# Patient Record
Sex: Male | Born: 1990 | Race: White | Hispanic: No | Marital: Single | Smoking: Never smoker
Health system: Southern US, Community
[De-identification: ages and names within clinical notes are randomized; demographics above are authoritative.]

## PROBLEM LIST (undated history)

## (undated) HISTORY — PX: CLAVICLE SURGERY: SHX598

---
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
## 2008-03-25 ENCOUNTER — Emergency Department (HOSPITAL_COMMUNITY): Admission: EM | Admit: 2008-03-25 | Discharge: 2008-03-25 | Payer: Self-pay | Admitting: Emergency Medicine

## 2010-06-06 ENCOUNTER — Inpatient Hospital Stay (HOSPITAL_COMMUNITY): Admission: AC | Admit: 2010-06-06 | Discharge: 2010-07-01 | Payer: Self-pay

## 2010-06-18 ENCOUNTER — Ambulatory Visit: Payer: Self-pay | Admitting: Physical Medicine & Rehabilitation

## 2010-06-24 ENCOUNTER — Encounter (INDEPENDENT_AMBULATORY_CARE_PROVIDER_SITE_OTHER): Payer: Self-pay | Admitting: General Surgery

## 2010-06-24 ENCOUNTER — Ambulatory Visit: Payer: Self-pay | Admitting: Vascular Surgery

## 2010-06-25 ENCOUNTER — Encounter (INDEPENDENT_AMBULATORY_CARE_PROVIDER_SITE_OTHER): Payer: Self-pay | Admitting: General Surgery

## 2010-07-01 ENCOUNTER — Inpatient Hospital Stay (HOSPITAL_COMMUNITY)
Admission: RE | Admit: 2010-07-01 | Discharge: 2010-07-28 | Payer: Self-pay | Admitting: Physical Medicine & Rehabilitation

## 2010-07-01 ENCOUNTER — Ambulatory Visit: Payer: Self-pay | Admitting: Physical Medicine & Rehabilitation

## 2010-07-04 ENCOUNTER — Ambulatory Visit: Payer: Self-pay | Admitting: Physical Medicine & Rehabilitation

## 2010-07-24 ENCOUNTER — Ambulatory Visit: Payer: Self-pay | Admitting: Physical Medicine & Rehabilitation

## 2010-07-30 ENCOUNTER — Encounter (HOSPITAL_COMMUNITY)
Admission: RE | Admit: 2010-07-30 | Discharge: 2010-08-29 | Payer: Self-pay | Admitting: Physical Medicine & Rehabilitation

## 2010-09-01 ENCOUNTER — Encounter (HOSPITAL_COMMUNITY)
Admission: RE | Admit: 2010-09-01 | Discharge: 2010-09-04 | Payer: Self-pay | Admitting: Physical Medicine & Rehabilitation

## 2010-09-01 ENCOUNTER — Encounter
Admission: RE | Admit: 2010-09-01 | Discharge: 2010-09-01 | Payer: Self-pay | Source: Home / Self Care | Attending: Physical Medicine & Rehabilitation | Admitting: Physical Medicine & Rehabilitation

## 2010-09-08 ENCOUNTER — Observation Stay (HOSPITAL_COMMUNITY): Admission: RE | Admit: 2010-09-08 | Discharge: 2010-09-09 | Payer: Self-pay | Admitting: Orthopaedic Surgery

## 2010-09-16 ENCOUNTER — Encounter (HOSPITAL_COMMUNITY)
Admission: RE | Admit: 2010-09-16 | Discharge: 2010-10-16 | Payer: Self-pay | Admitting: Physical Medicine & Rehabilitation

## 2010-10-15 ENCOUNTER — Encounter
Admission: RE | Admit: 2010-10-15 | Discharge: 2010-12-15 | Payer: Self-pay | Source: Home / Self Care | Attending: Physical Medicine & Rehabilitation | Admitting: Physical Medicine & Rehabilitation

## 2010-10-23 ENCOUNTER — Ambulatory Visit: Payer: Self-pay | Admitting: Physical Medicine & Rehabilitation

## 2010-10-23 ENCOUNTER — Ambulatory Visit (HOSPITAL_COMMUNITY)
Admission: RE | Admit: 2010-10-23 | Discharge: 2010-10-23 | Payer: Self-pay | Admitting: Physical Medicine & Rehabilitation

## 2010-11-03 ENCOUNTER — Ambulatory Visit (HOSPITAL_COMMUNITY)
Admission: RE | Admit: 2010-11-03 | Discharge: 2010-11-03 | Payer: Self-pay | Source: Home / Self Care | Admitting: Physical Medicine & Rehabilitation

## 2010-12-15 ENCOUNTER — Encounter
Admission: RE | Admit: 2010-12-15 | Discharge: 2011-01-05 | Payer: Self-pay | Source: Home / Self Care | Attending: Physical Medicine & Rehabilitation | Admitting: Physical Medicine & Rehabilitation

## 2010-12-18 ENCOUNTER — Ambulatory Visit
Admission: RE | Admit: 2010-12-18 | Discharge: 2010-12-18 | Payer: Self-pay | Source: Home / Self Care | Attending: Physical Medicine & Rehabilitation | Admitting: Physical Medicine & Rehabilitation

## 2011-02-18 LAB — SURGICAL PCR SCREEN: Staphylococcus aureus: POSITIVE — AB

## 2011-02-19 LAB — PREALBUMIN: Prealbumin: 21 mg/dL (ref 18.0–45.0)

## 2011-02-19 LAB — CBC
HCT: 39.8 % (ref 39.0–52.0)
Hemoglobin: 13.2 g/dL (ref 13.0–17.0)
MCHC: 33.2 g/dL (ref 30.0–36.0)
MCV: 95.9 fL (ref 78.0–100.0)
RDW: 13.9 % (ref 11.5–15.5)

## 2011-02-19 LAB — BASIC METABOLIC PANEL
BUN: 7 mg/dL (ref 6–23)
GFR calc Af Amer: >60 mL/min (ref >60)
GFR calc non Af Amer: >60 mL/min (ref >60)
Glucose, Bld: 97 mg/dL (ref 70–99)

## 2011-02-20 LAB — COMPREHENSIVE METABOLIC PANEL
ALT: 19 U/L (ref 0–53)
AST: 28 U/L (ref 0–37)
CO2: 27 mEq/L (ref 19–32)
Chloride: 104 mEq/L (ref 96–112)
Creatinine, Ser: 0.59 mg/dL (ref 0.4–1.5)
GFR calc Af Amer: >60 mL/min (ref >60)
GFR calc non Af Amer: >60 mL/min (ref >60)
Glucose, Bld: 97 mg/dL (ref 70–99)
Total Bilirubin: 0.6 mg/dL (ref 0.3–1.2)

## 2011-02-20 LAB — CBC
HCT: 36 % — ABNORMAL LOW (ref 39.0–52.0)
HCT: 37.6 % — ABNORMAL LOW (ref 39.0–52.0)
HCT: 40.8 % (ref 39.0–52.0)
Hemoglobin: 12.2 g/dL — ABNORMAL LOW (ref 13.0–17.0)
Hemoglobin: 12.7 g/dL — ABNORMAL LOW (ref 13.0–17.0)
Hemoglobin: 13.8 g/dL (ref 13.0–17.0)
MCH: 31.7 pg (ref 26.0–34.0)
MCH: 32.3 pg (ref 26.0–34.0)
MCH: 32.4 pg (ref 26.0–34.0)
MCH: 32.5 pg (ref 26.0–34.0)
MCHC: 34 g/dL (ref 30.0–36.0)
MCV: 95.3 fL (ref 78.0–100.0)
MCV: 96.1 fL (ref 78.0–100.0)
Platelets: 402 10*3/uL — ABNORMAL HIGH (ref 150–400)
Platelets: 581 10*3/uL — ABNORMAL HIGH (ref 150–400)
Platelets: 653 10*3/uL — ABNORMAL HIGH (ref 150–400)
Platelets: 700 10*3/uL — ABNORMAL HIGH (ref 150–400)
RBC: 3.77 MIL/uL — ABNORMAL LOW (ref 4.22–5.81)
RBC: 3.93 MIL/uL — ABNORMAL LOW (ref 4.22–5.81)
RBC: 3.97 MIL/uL — ABNORMAL LOW (ref 4.22–5.81)
RBC: 4.07 MIL/uL — ABNORMAL LOW (ref 4.22–5.81)
RBC: 4.09 MIL/uL — ABNORMAL LOW (ref 4.22–5.81)
RBC: 4.23 MIL/uL (ref 4.22–5.81)
RDW: 13.9 % (ref 11.5–15.5)
RDW: 14.5 % (ref 11.5–15.5)
RDW: 14.5 % (ref 11.5–15.5)
RDW: 14.6 % (ref 11.5–15.5)
WBC: 14 10*3/uL — ABNORMAL HIGH (ref 4.0–10.5)
WBC: 7.1 10*3/uL (ref 4.0–10.5)
WBC: 8.4 10*3/uL (ref 4.0–10.5)
WBC: 9.6 10*3/uL (ref 4.0–10.5)

## 2011-02-20 LAB — DIFFERENTIAL
Basophils Absolute: 0 10*3/uL (ref 0.0–0.1)
Basophils Relative: 1 % (ref 0–1)
Eosinophils Absolute: 0.4 10*3/uL (ref 0.0–0.7)
Eosinophils Absolute: 0.5 10*3/uL (ref 0.0–0.7)
Eosinophils Absolute: 0.4 10*3/uL (ref 0.0–0.7)
Eosinophils Relative: 5 % (ref 0–5)
Lymphocytes Relative: 13 % (ref 12–46)
Lymphocytes Relative: 19 % (ref 12–46)
Lymphs Abs: 1.7 10*3/uL (ref 0.7–4.0)
Monocytes Absolute: 1 10*3/uL (ref 0.1–1.0)
Monocytes Relative: 6 % (ref 3–12)
Neutro Abs: 10.6 10*3/uL — ABNORMAL HIGH (ref 1.7–7.7)
Neutro Abs: 13.2 10*3/uL — ABNORMAL HIGH (ref 1.7–7.7)
Neutrophils Relative %: 75 % (ref 43–77)
Neutrophils Relative %: 80 % — ABNORMAL HIGH (ref 43–77)

## 2011-02-20 LAB — BASIC METABOLIC PANEL
BUN: 20 mg/dL (ref 6–23)
BUN: 22 mg/dL (ref 6–23)
BUN: 23 mg/dL (ref 6–23)
CO2: 27 mEq/L (ref 19–32)
CO2: 28 mEq/L (ref 19–32)
Calcium: 9.3 mg/dL (ref 8.4–10.5)
Chloride: 104 mEq/L (ref 96–112)
Chloride: 105 mEq/L (ref 96–112)
Chloride: 105 mEq/L (ref 96–112)
Creatinine, Ser: 0.64 mg/dL (ref 0.4–1.5)
Creatinine, Ser: 0.65 mg/dL (ref 0.4–1.5)
Creatinine, Ser: 0.71 mg/dL (ref 0.4–1.5)
GFR calc Af Amer: >60 mL/min (ref >60)
GFR calc Af Amer: >60 mL/min (ref >60)
GFR calc Af Amer: >60 mL/min (ref >60)
GFR calc Af Amer: >60 mL/min (ref >60)
GFR calc non Af Amer: >60 mL/min (ref >60)
GFR calc non Af Amer: >60 mL/min (ref >60)
Glucose, Bld: 119 mg/dL — ABNORMAL HIGH (ref 70–99)
Potassium: 3.7 mEq/L (ref 3.5–5.1)
Sodium: 144 mEq/L (ref 135–145)

## 2011-02-20 LAB — CULTURE, BLOOD (SINGLE): Culture: NO GROWTH

## 2011-02-20 LAB — URINE CULTURE: Colony Count: NO GROWTH

## 2011-02-20 LAB — CULTURE, BAL-QUANTITATIVE W GRAM STAIN

## 2011-02-21 LAB — TYPE AND SCREEN: ABO/RH(D): O POS

## 2011-02-21 LAB — CBC
HCT: 27.4 % — ABNORMAL LOW (ref 39.0–52.0)
HCT: 29.4 % — ABNORMAL LOW (ref 39.0–52.0)
HCT: 29.4 % — ABNORMAL LOW (ref 39.0–52.0)
HCT: 29.7 % — ABNORMAL LOW (ref 39.0–52.0)
HCT: 31.7 % — ABNORMAL LOW (ref 39.0–52.0)
HCT: 32 % — ABNORMAL LOW (ref 39.0–52.0)
HCT: 32.9 % — ABNORMAL LOW (ref 39.0–52.0)
HCT: 34.5 % — ABNORMAL LOW (ref 39.0–52.0)
HCT: 35.5 % — ABNORMAL LOW (ref 39.0–52.0)
Hemoglobin: 10.2 g/dL — ABNORMAL LOW (ref 13.0–17.0)
Hemoglobin: 10.6 g/dL — ABNORMAL LOW (ref 13.0–17.0)
Hemoglobin: 11 g/dL — ABNORMAL LOW (ref 13.0–17.0)
Hemoglobin: 11 g/dL — ABNORMAL LOW (ref 13.0–17.0)
Hemoglobin: 11.4 g/dL — ABNORMAL LOW (ref 13.0–17.0)
Hemoglobin: 12 g/dL — ABNORMAL LOW (ref 13.0–17.0)
Hemoglobin: 13.9 g/dL (ref 13.0–17.0)
Hemoglobin: 9.5 g/dL — ABNORMAL LOW (ref 13.0–17.0)
Hemoglobin: 9.9 g/dL — ABNORMAL LOW (ref 13.0–17.0)
MCH: 32 pg (ref 26.0–34.0)
MCH: 32.3 pg (ref 26.0–34.0)
MCH: 32.4 pg (ref 26.0–34.0)
MCH: 32.6 pg (ref 26.0–34.0)
MCH: 33 pg (ref 26.0–34.0)
MCH: 33 pg (ref 26.0–34.0)
MCH: 33.1 pg (ref 26.0–34.0)
MCH: 33.7 pg (ref 26.0–34.0)
MCHC: 33.8 g/dL (ref 30.0–36.0)
MCHC: 33.9 g/dL (ref 30.0–36.0)
MCHC: 34.3 g/dL (ref 30.0–36.0)
MCHC: 34.5 g/dL (ref 30.0–36.0)
MCHC: 34.7 g/dL (ref 30.0–36.0)
MCHC: 34.7 g/dL (ref 30.0–36.0)
MCV: 94.7 fL (ref 78.0–100.0)
MCV: 95.3 fL (ref 78.0–100.0)
MCV: 95.5 fL (ref 78.0–100.0)
MCV: 96.1 fL (ref 78.0–100.0)
MCV: 96.2 fL (ref 78.0–100.0)
Platelets: 224 10*3/uL (ref 150–400)
Platelets: 250 10*3/uL (ref 150–400)
Platelets: 540 10*3/uL — ABNORMAL HIGH (ref 150–400)
Platelets: 571 10*3/uL — ABNORMAL HIGH (ref 150–400)
RBC: 2.86 MIL/uL — ABNORMAL LOW (ref 4.22–5.81)
RBC: 3.2 MIL/uL — ABNORMAL LOW (ref 4.22–5.81)
RBC: 3.32 MIL/uL — ABNORMAL LOW (ref 4.22–5.81)
RBC: 3.33 MIL/uL — ABNORMAL LOW (ref 4.22–5.81)
RBC: 3.34 MIL/uL — ABNORMAL LOW (ref 4.22–5.81)
RBC: 3.45 MIL/uL — ABNORMAL LOW (ref 4.22–5.81)
RBC: 3.75 MIL/uL — ABNORMAL LOW (ref 4.22–5.81)
RDW: 14.2 % (ref 11.5–15.5)
RDW: 14.3 % (ref 11.5–15.5)
RDW: 14.4 % (ref 11.5–15.5)
RDW: 14.7 % (ref 11.5–15.5)
WBC: 12.6 10*3/uL — ABNORMAL HIGH (ref 4.0–10.5)
WBC: 12.8 10*3/uL — ABNORMAL HIGH (ref 4.0–10.5)
WBC: 14.3 10*3/uL — ABNORMAL HIGH (ref 4.0–10.5)
WBC: 15.9 10*3/uL — ABNORMAL HIGH (ref 4.0–10.5)
WBC: 16.4 10*3/uL — ABNORMAL HIGH (ref 4.0–10.5)
WBC: 17.2 10*3/uL — ABNORMAL HIGH (ref 4.0–10.5)

## 2011-02-21 LAB — COMPREHENSIVE METABOLIC PANEL
ALT: 10 U/L (ref 0–53)
ALT: 12 U/L (ref 0–53)
AST: 22 U/L (ref 0–37)
AST: 33 U/L (ref 0–37)
AST: 62 U/L — ABNORMAL HIGH (ref 0–37)
Albumin: 2.3 g/dL — ABNORMAL LOW (ref 3.5–5.2)
Albumin: 2.3 g/dL — ABNORMAL LOW (ref 3.5–5.2)
Albumin: 2.6 g/dL — ABNORMAL LOW (ref 3.5–5.2)
Alkaline Phosphatase: 53 U/L (ref 39–117)
Alkaline Phosphatase: 54 U/L (ref 39–117)
BUN: 10 mg/dL (ref 6–23)
BUN: 12 mg/dL (ref 6–23)
BUN: 6 mg/dL (ref 6–23)
BUN: 8 mg/dL (ref 6–23)
CO2: 20 mEq/L (ref 19–32)
CO2: 25 mEq/L (ref 19–32)
CO2: 25 mEq/L (ref 19–32)
Calcium: 7.7 mg/dL — ABNORMAL LOW (ref 8.4–10.5)
Calcium: 8.4 mg/dL (ref 8.4–10.5)
Calcium: 8.9 mg/dL (ref 8.4–10.5)
Chloride: 105 mEq/L (ref 96–112)
Chloride: 107 mEq/L (ref 96–112)
Chloride: 109 mEq/L (ref 96–112)
Creatinine, Ser: 0.48 mg/dL (ref 0.4–1.5)
Creatinine, Ser: 0.51 mg/dL (ref 0.4–1.5)
Creatinine, Ser: 0.74 mg/dL (ref 0.4–1.5)
Creatinine, Ser: 0.88 mg/dL (ref 0.4–1.5)
GFR calc Af Amer: >60 mL/min (ref >60)
GFR calc Af Amer: >60 mL/min (ref >60)
GFR calc Af Amer: >60 mL/min (ref >60)
GFR calc Af Amer: >60 mL/min (ref >60)
GFR calc non Af Amer: >60 mL/min (ref >60)
GFR calc non Af Amer: >60 mL/min (ref >60)
Glucose, Bld: 116 mg/dL — ABNORMAL HIGH (ref 70–99)
Glucose, Bld: 169 mg/dL — ABNORMAL HIGH (ref 70–99)
Potassium: 4.1 mEq/L (ref 3.5–5.1)
Sodium: 138 mEq/L (ref 135–145)
Total Bilirubin: 0.4 mg/dL (ref 0.3–1.2)
Total Bilirubin: 0.8 mg/dL (ref 0.3–1.2)
Total Protein: 6.5 g/dL (ref 6.0–8.3)

## 2011-02-21 LAB — CULTURE, BLOOD (ROUTINE X 2)

## 2011-02-21 LAB — DIFFERENTIAL
Basophils Absolute: 0 10*3/uL (ref 0.0–0.1)
Basophils Relative: 0 % (ref 0–1)
Eosinophils Absolute: 0.5 10*3/uL (ref 0.0–0.7)
Lymphocytes Relative: 3 % — ABNORMAL LOW (ref 12–46)
Lymphs Abs: 0.9 10*3/uL (ref 0.7–4.0)
Lymphs Abs: 1.3 10*3/uL (ref 0.7–4.0)
Monocytes Absolute: 0.5 10*3/uL (ref 0.1–1.0)
Monocytes Absolute: 0.7 10*3/uL (ref 0.1–1.0)
Monocytes Absolute: 0.9 10*3/uL (ref 0.1–1.0)
Monocytes Relative: 4 % (ref 3–12)
Monocytes Relative: 5 % (ref 3–12)
Monocytes Relative: 7 % (ref 3–12)
Neutro Abs: 10.9 10*3/uL — ABNORMAL HIGH (ref 1.7–7.7)
Neutro Abs: 14.7 10*3/uL — ABNORMAL HIGH (ref 1.7–7.7)
Neutrophils Relative %: 84 % — ABNORMAL HIGH (ref 43–77)
Neutrophils Relative %: 92 % — ABNORMAL HIGH (ref 43–77)

## 2011-02-21 LAB — POCT I-STAT 7, (LYTES, BLD GAS, ICA,H+H)
Acid-base deficit: 5 mmol/L — ABNORMAL HIGH (ref 0.0–2.0)
Bicarbonate: 21.3 meq/L (ref 20.0–24.0)
Calcium, Ion: 1.01 mmol/L — ABNORMAL LOW (ref 1.12–1.32)
O2 Saturation: 100 %
TCO2: 22 mmol/L (ref 0–100)
pO2, Arterial: 548 mmHg — ABNORMAL HIGH (ref 80.0–100.0)

## 2011-02-21 LAB — HEMOGLOBIN AND HEMATOCRIT, BLOOD: Hemoglobin: 3.6 g/dL — CL (ref 13.0–17.0)

## 2011-02-21 LAB — BLOOD GAS, ARTERIAL
Acid-base deficit: 0.3 mmol/L (ref 0.0–2.0)
Bicarbonate: 24.2 mEq/L — ABNORMAL HIGH (ref 20.0–24.0)
Drawn by: 257081
Drawn by: 33098
Drawn by: 330991
FIO2: 0.4 %
MECHVT: 500 mL
PEEP: 5 cmH2O
PEEP: 5 cmH2O
Patient temperature: 98.6
RATE: 18 resp/min
RATE: 18 resp/min
TCO2: 24.6 mmol/L (ref 0–100)
pCO2 arterial: 36.1 mmHg (ref 35.0–45.0)
pCO2 arterial: 36.2 mmHg (ref 35.0–45.0)
pCO2 arterial: 36.9 mmHg (ref 35.0–45.0)
pH, Arterial: 7.441 (ref 7.350–7.450)
pO2, Arterial: 49.4 mmHg — ABNORMAL LOW (ref 80.0–100.0)
pO2, Arterial: 73.5 mmHg — ABNORMAL LOW (ref 80.0–100.0)

## 2011-02-21 LAB — POCT I-STAT, CHEM 8
Calcium, Ion: 1 mmol/L — ABNORMAL LOW (ref 1.12–1.32)
Creatinine, Ser: 0.9 mg/dL (ref 0.4–1.5)
Glucose, Bld: 121 mg/dL — ABNORMAL HIGH (ref 70–99)
HCT: 43 % (ref 39.0–52.0)
Hemoglobin: 12.9 g/dL — ABNORMAL LOW (ref 13.0–17.0)
Hemoglobin: 14.6 g/dL (ref 13.0–17.0)
Potassium: 4.2 meq/L (ref 3.5–5.1)
TCO2: 17 mmol/L (ref 0–100)

## 2011-02-21 LAB — PREPARE FRESH FROZEN PLASMA

## 2011-02-21 LAB — BASIC METABOLIC PANEL
BUN: 10 mg/dL (ref 6–23)
BUN: 11 mg/dL (ref 6–23)
BUN: 14 mg/dL (ref 6–23)
CO2: 27 mEq/L (ref 19–32)
CO2: 27 mEq/L (ref 19–32)
CO2: 30 mEq/L (ref 19–32)
Calcium: 8.4 mg/dL (ref 8.4–10.5)
Calcium: 8.5 mg/dL (ref 8.4–10.5)
Chloride: 106 mEq/L (ref 96–112)
Chloride: 107 mEq/L (ref 96–112)
Creatinine, Ser: 0.63 mg/dL (ref 0.4–1.5)
GFR calc Af Amer: >60 mL/min (ref >60)
GFR calc Af Amer: >60 mL/min (ref >60)
GFR calc Af Amer: >60 mL/min (ref >60)
GFR calc non Af Amer: >60 mL/min (ref >60)
GFR calc non Af Amer: >60 mL/min (ref >60)
GFR calc non Af Amer: >60 mL/min (ref >60)
GFR calc non Af Amer: >60 mL/min (ref >60)
Glucose, Bld: 106 mg/dL — ABNORMAL HIGH (ref 70–99)
Glucose, Bld: 111 mg/dL — ABNORMAL HIGH (ref 70–99)
Glucose, Bld: 135 mg/dL — ABNORMAL HIGH (ref 70–99)
Glucose, Bld: 136 mg/dL — ABNORMAL HIGH (ref 70–99)
Potassium: 3.3 mEq/L — ABNORMAL LOW (ref 3.5–5.1)
Potassium: 3.4 mEq/L — ABNORMAL LOW (ref 3.5–5.1)
Potassium: 3.6 mEq/L (ref 3.5–5.1)
Potassium: 3.7 mEq/L (ref 3.5–5.1)
Potassium: 3.8 mEq/L (ref 3.5–5.1)
Potassium: 3.9 mEq/L (ref 3.5–5.1)
Potassium: 3.9 mEq/L (ref 3.5–5.1)
Sodium: 135 mEq/L (ref 135–145)
Sodium: 137 mEq/L (ref 135–145)
Sodium: 139 mEq/L (ref 135–145)
Sodium: 141 mEq/L (ref 135–145)
Sodium: 142 mEq/L (ref 135–145)
Sodium: 145 mEq/L (ref 135–145)

## 2011-02-21 LAB — CULTURE, BAL-QUANTITATIVE W GRAM STAIN: Colony Count: >=100000

## 2011-02-21 LAB — PROTIME-INR
INR: 1.23 (ref 0.00–1.49)
Prothrombin Time: 15.4 seconds — ABNORMAL HIGH (ref 11.6–15.2)

## 2011-02-21 LAB — URINE CULTURE: Colony Count: NO GROWTH

## 2011-02-21 LAB — LACTIC ACID, PLASMA
Lactic Acid, Venous: 1.2 mmol/L (ref 0.5–2.2)
Lactic Acid, Venous: 3.2 mmol/L — ABNORMAL HIGH (ref 0.5–2.2)

## 2011-02-21 LAB — APTT
aPTT: 135 s — ABNORMAL HIGH (ref 24–37)
aPTT: 30 seconds (ref 24–37)
aPTT: 39 seconds — ABNORMAL HIGH (ref 24–37)

## 2011-02-21 LAB — MRSA PCR SCREENING: MRSA by PCR: NEGATIVE

## 2011-06-15 ENCOUNTER — Encounter: Payer: Self-pay | Admitting: Physical Medicine & Rehabilitation

## 2011-07-19 ENCOUNTER — Encounter: Payer: Self-pay | Admitting: Physical Medicine & Rehabilitation
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
## 2014-01-13 ENCOUNTER — Emergency Department (HOSPITAL_COMMUNITY): Payer: Worker's Compensation

## 2014-01-13 ENCOUNTER — Encounter (HOSPITAL_COMMUNITY): Payer: Self-pay | Admitting: Emergency Medicine

## 2014-01-13 ENCOUNTER — Emergency Department (HOSPITAL_COMMUNITY)
Admission: EM | Admit: 2014-01-13 | Discharge: 2014-01-14 | Disposition: A | Discharge status: Home / Self Care | Payer: Worker's Compensation | Attending: Emergency Medicine | Admitting: Emergency Medicine

## 2014-01-13 DIAGNOSIS — M7989 Other specified soft tissue disorders: Secondary | ICD-10-CM

## 2014-01-13 DIAGNOSIS — G8911 Acute pain due to trauma: Secondary | ICD-10-CM | POA: Insufficient documentation

## 2014-01-13 MED ORDER — NAPROXEN 500 MG PO TABS: 500.0000 mg | ORAL_TABLET | Freq: Two times a day (BID) | ORAL | Status: AC

## 2014-01-13 MED ORDER — SULFAMETHOXAZOLE-TRIMETHOPRIM 800-160 MG PO TABS: 1.0000 | ORAL_TABLET | Freq: Two times a day (BID) | ORAL | Status: AC

## 2014-01-13 NOTE — ED Provider Notes (Signed)
CSN: [id number]     Arrival date & time 01/13/14  2148 History  This chart was scribed for Vida RollerBrian D Yahayra Geis, MD by Dorothey Basemania Sutton, ED Scribe. This patient was seen in room APA16A/APA16A and the patient's care was started at 11:42 PM.    Chief Complaint  Patient presents with  . Finger Injury   The history is provided by the patient. No language interpreter was used.   HPI Comments: Jason Clay is a 23 y.o. male who presents to the Emergency Department complaining of an injury to the left index finger that occurred 2 weeks ago when he states that he cut the finger a metal edge while at work. He reports that he was seen by his PCP after the injury occurred, but that the wound was not repaired with sutures and he did not receive antibiotics. He states that the wound has since healed, but patient is complaining of a constant pain with associated swelling and erythema to the area that he states has been progressively worsening since the incident. He reports applying neosporin and hydrogen peroxide to the area at home without relief. He denies any drainage from the area and states that the area stopped bleeding last week. He denies history of frequent infections. Patient has no other pertinent medical history.   History reviewed. No pertinent past medical history. Past Surgical History  Procedure Laterality Date  . Clavicle surgery     History reviewed. No pertinent family history. History  Substance Use Topics  . Smoking status: Never Smoker   . Smokeless tobacco: Current User    Types: Chew  . Alcohol Use: No    Review of Systems  Constitutional: Negative for fever.  Musculoskeletal: Positive for arthralgias and joint swelling.  Skin: Positive for color change (erythema) and wound (healing).  Neurological: Negative for numbness.    Allergies  Review of patient's allergies indicates no known allergies.  Home Medications   Current Outpatient Rx  Name  Route  Sig  Dispense  Refill  .  naproxen (NAPROSYN) 500 MG tablet   Oral   Take 1 tablet (500 mg total) by mouth 2 (two) times daily with a meal.   30 tablet   0   . naproxen (NAPROSYN) 500 MG tablet   Oral   Take 1 tablet (500 mg total) by mouth 2 (two) times daily with a meal.   30 tablet   0   . sulfamethoxazole-trimethoprim (SEPTRA DS) 800-160 MG per tablet   Oral   Take 1 tablet by mouth every 12 (twelve) hours.   20 tablet   0     Triage Vitals: BP 146/87  Pulse 67  Temp(Src) 97.5 F (36.4 C) (Oral)  Resp 24  Ht 5\' 10"  (1.778 m)  Wt 185 lb (83.915 kg)  BMI 26.54 kg/m2  SpO2 98%  Physical Exam  Nursing note and vitals reviewed. Constitutional: He is oriented to person, place, and time. He appears well-developed and well-nourished. No distress.  HENT:  Head: Normocephalic and atraumatic.  Eyes: Conjunctivae are normal.  Neck: Normal range of motion. Neck supple.  Pulmonary/Chest: Effort normal. No respiratory distress.  Abdominal: He exhibits no distension.  Musculoskeletal: Normal range of motion. He exhibits tenderness.  PIp of the left index finger is swollen, mildly tender, and minimally erythematous without induration, drainage, or fluctuance. Range of motion is restricted to 90 degrees at that joint.   Neurological: He is alert and oriented to person, place, and time.  Distal sensation  intact.   Skin: Skin is warm and dry.  Psychiatric: He has a normal mood and affect. His behavior is normal.    ED Course  Procedures (including critical care time)  DIAGNOSTIC STUDIES: Oxygen Saturation is 98% on room air, normal by my interpretation.    COORDINATION OF CARE: 11:44 PM- Performed a limited bedside ultrasound of the area that showed normal bony cortex around the IP joint of said finger with scant fluid on the dorsal aspect of the finger. Independently reviewed x-ray results and dicussed that it did not indicate any occult fracture or dislocation. Discussed that there is no need for an  incision and drainage of the area at this time. Will discharge patient with antibiotics and anti-inflammatory medication. Patient states that he has seen an orthopedist in the past at [facility] and [facility], but does not remember the name of the physician. Advised patient to follow up with the referred orthopedist. Return precautions given. Discussed treatment plan with patient at bedside and patient verbalized agreement.     Labs Review Labs Reviewed - No data to display  Imaging Review Dg Finger Index Left  01/13/2014   CLINICAL DATA:  Finger injury 2 weeks ago.  Finger pain.  EXAM: LEFT INDEX FINGER 2+V  COMPARISON:  None.  FINDINGS: There is marked soft tissue swelling over the dorsal aspect of the PIP joint of the index finger. No osseous erosions to suggest osteomyelitis. DIP joint appears normal. The alignment of the finger is anatomic.  IMPRESSION: Marked soft tissue swelling over the dorsal PIP joint of the index finger without osseous abnormality.   Electronically Signed   By: Andreas Newport M.D.   On: 01/13/2014 23:39    EKG Interpretation   None       MDM   1. Swelling of finger, left    Inflammatory process most likely - no hard signs of infection - he is upset b/c it is not healing very quickly - NSAIDs, course of abx though unlikley abscess,  Hand f/u as he may need PT or surgical procedure if continued limited rOM.  Meds given in ED:  Medications  naproxen (NAPROSYN) tablet 500 mg (500 mg Oral Given 01/14/14 0007)    Discharge Medication List as of 01/13/2014 11:56 PM    START taking these medications   Details  !! naproxen (NAPROSYN) 500 MG tablet Take 1 tablet (500 mg total) by mouth 2 (two) times daily with a meal., Starting 01/13/2014, Until Discontinued, Print    !! naproxen (NAPROSYN) 500 MG tablet Take 1 tablet (500 mg total) by mouth 2 (two) times daily with a meal., Starting 01/13/2014, Until Discontinued, Print    sulfamethoxazole-trimethoprim (SEPTRA DS)  800-160 MG per tablet Take 1 tablet by mouth every 12 (twelve) hours., Starting 01/13/2014, Until Discontinued, Print     !! - Potential duplicate medications found. Please discuss with provider.        I personally performed the services described in this documentation, which was scribed in my presence. The recorded information has been reviewed and is accurate.       Vida Roller, MD 01/14/14 [phone]

## 2014-01-13 NOTE — Discharge Instructions (Signed)
Please call your orthopedist for recheck in one week.  If you don't have an orthopedist, you [place] see [place] contact information or Dr. Hilda LiasKeeling who is on call today for [place] (general orthopedist).    Naprosyn twice daily.    Bactrim twice daily (10 days)  Please call your doctor for a followup appointment within 24-48 hours. When you talk to your doctor please let them know that you were seen in the emergency department and have them acquire all of your records so that they can discuss the findings with you and formulate a treatment plan to fully care for your new and ongoing problems.

## 2014-01-13 NOTE — ED Notes (Signed)
Pt cut left pointer finger 2 wks ago and was seen by his PCP. Pt did not receive stitches. Pt states as wound has healed, his finger swells more and more. Finger does appear swollen and red.

## 2014-01-14 MED ORDER — NAPROXEN 250 MG PO TABS
500.0000 mg | ORAL_TABLET | Freq: Once | ORAL | Status: AC
  Administered 2014-01-14: 500 mg via ORAL
  Filled 2014-01-14: qty 2

## 2014-09-28 ENCOUNTER — Encounter (HOSPITAL_COMMUNITY): Payer: Self-pay | Admitting: Emergency Medicine

## 2014-09-28 ENCOUNTER — Emergency Department (HOSPITAL_COMMUNITY)
Admission: EM | Admit: 2014-09-28 | Discharge: 2014-09-28 | Disposition: A | Discharge status: Home / Self Care | Payer: Self-pay | Attending: Emergency Medicine | Admitting: Emergency Medicine

## 2014-09-28 ENCOUNTER — Emergency Department (HOSPITAL_COMMUNITY): Payer: BC Managed Care – PPO

## 2014-09-28 DIAGNOSIS — N432 Other hydrocele: Secondary | ICD-10-CM | POA: Insufficient documentation

## 2014-09-28 DIAGNOSIS — N508 Other specified disorders of male genital organs: Secondary | ICD-10-CM | POA: Insufficient documentation

## 2014-09-28 DIAGNOSIS — N50812 Left testicular pain: Secondary | ICD-10-CM

## 2014-09-28 DIAGNOSIS — Z791 Long term (current) use of non-steroidal anti-inflammatories (NSAID): Secondary | ICD-10-CM | POA: Insufficient documentation

## 2014-09-28 DIAGNOSIS — Z792 Long term (current) use of antibiotics: Secondary | ICD-10-CM | POA: Insufficient documentation

## 2014-09-28 LAB — URINALYSIS, ROUTINE W REFLEX MICROSCOPIC
BILIRUBIN URINE: NEGATIVE
GLUCOSE, UA: NEGATIVE mg/dL
Hgb urine dipstick: NEGATIVE
KETONES UR: NEGATIVE mg/dL
Leukocytes, UA: NEGATIVE
Nitrite: NEGATIVE
PROTEIN: NEGATIVE mg/dL
Specific Gravity, Urine: <1.005 — ABNORMAL LOW (ref 1.005–1.030)
UROBILINOGEN UA: 0.2 mg/dL (ref 0.0–1.0)
pH: 7 (ref 5.0–8.0)

## 2014-09-28 LAB — CBC WITH DIFFERENTIAL/PLATELET
BASOS PCT: 0 % (ref 0–1)
Basophils Absolute: 0 10*3/uL (ref 0.0–0.1)
EOS PCT: 0 % (ref 0–5)
Eosinophils Absolute: 0 10*3/uL (ref 0.0–0.7)
HCT: 43.5 % (ref 39.0–52.0)
HEMOGLOBIN: 15.4 g/dL (ref 13.0–17.0)
LYMPHS ABS: 1.5 10*3/uL (ref 0.7–4.0)
Lymphocytes Relative: 18 % (ref 12–46)
MCH: 33.5 pg (ref 26.0–34.0)
MCHC: 35.4 g/dL (ref 30.0–36.0)
MCV: 94.6 fL (ref 78.0–100.0)
MONO ABS: 0.8 10*3/uL (ref 0.1–1.0)
MONOS PCT: 10 % (ref 3–12)
NEUTROS PCT: 72 % (ref 43–77)
Neutro Abs: 5.9 10*3/uL (ref 1.7–7.7)
Platelets: 273 10*3/uL (ref 150–400)
RBC: 4.6 MIL/uL (ref 4.22–5.81)
RDW: 12.5 % (ref 11.5–15.5)
WBC: 8.3 10*3/uL (ref 4.0–10.5)

## 2014-09-28 LAB — BASIC METABOLIC PANEL
Anion gap: 15 (ref 5–15)
BUN: 9 mg/dL (ref 6–23)
CHLORIDE: 101 meq/L (ref 96–112)
CO2: 24 mEq/L (ref 19–32)
CREATININE: 0.82 mg/dL (ref 0.50–1.35)
Calcium: 9.1 mg/dL (ref 8.4–10.5)
GFR calc Af Amer: >90 mL/min (ref >90)
GFR calc non Af Amer: >90 mL/min (ref >90)
GLUCOSE: 92 mg/dL (ref 70–99)
Potassium: 3.6 mEq/L — ABNORMAL LOW (ref 3.7–5.3)
Sodium: 140 mEq/L (ref 137–147)

## 2014-09-28 MED ORDER — MORPHINE SULFATE 4 MG/ML IJ SOLN
4.0000 mg | Freq: Once | INTRAMUSCULAR | Status: AC
  Administered 2014-09-28: 4 mg via INTRAVENOUS
  Filled 2014-09-28: qty 1

## 2014-09-28 MED ORDER — ONDANSETRON HCL 4 MG/2ML IJ SOLN
4.0000 mg | Freq: Once | INTRAMUSCULAR | Status: AC
  Administered 2014-09-28: 4 mg via INTRAVENOUS
  Filled 2014-09-28: qty 2

## 2014-09-28 NOTE — ED Provider Notes (Signed)
CSN: [id number]     Arrival date & time 09/28/14  0006 History  This chart was scribed for Joya Gaskinsonald W Lorenna Lurry, MD by Richarda Overlieichard Holland, ED Scribe. This patient was seen in room APA11/APA11 and the patient's care was started 12:22 AM.    Chief Complaint  Patient presents with  . Groin Pain    The history is provided by the patient. No language interpreter was used.   HPI Comments: Jason Clay is a 23 y.o. male who presents to the Emergency Department complaining of sudden onset of left testicular pain since yesterday monring. He reports no injury to the area. He denies a history of abdominal surgeries or hernias. He denies fever, vomiting, abdominal pain and back pain as associated symptoms. He denies dysuria and penile discharge as well. He reports no modifying factors at this time.   PMH - none Past Surgical History  Procedure Laterality Date  . Clavicle surgery     No family history on file. History  Substance Use Topics  . Smoking status: Never Smoker   . Smokeless tobacco: Current User    Types: Chew  . Alcohol Use: No    Review of Systems  Constitutional: Negative for fever.  Gastrointestinal: Negative for vomiting.  Genitourinary: Positive for testicular pain. Negative for dysuria and discharge.  Musculoskeletal: Negative for back pain.  All other systems reviewed and are negative.     Allergies  Review of patient's allergies indicates no known allergies.  Home Medications   Prior to Admission medications   Medication Sig Start Date End Date Taking? Authorizing Provider  naproxen (NAPROSYN) 500 MG tablet Take 1 tablet (500 mg total) by mouth 2 (two) times daily with a meal. 01/13/14   Vida RollerBrian D Miller, MD  naproxen (NAPROSYN) 500 MG tablet Take 1 tablet (500 mg total) by mouth 2 (two) times daily with a meal. 01/13/14   Vida RollerBrian D Miller, MD  sulfamethoxazole-trimethoprim (SEPTRA DS) 800-160 MG per tablet Take 1 tablet by mouth every 12 (twelve) hours. 01/13/14   Vida RollerBrian D  Miller, MD   BP 133/77  Pulse 70  Temp(Src) 97.9 F (36.6 C) (Oral)  Resp 18  Ht 5\' 10"  (1.778 m)  Wt 200 lb (90.719 kg)  BMI 28.70 kg/m2  SpO2 98%  Physical Exam  Nursing note and vitals reviewed.  CONSTITUTIONAL: Well developed/well nourished HEAD: Normocephalic/atraumatic EYES: EOMI/PERRL ENMT: Mucous membranes moist NECK: supple no meningeal signs SPINE:entire spine nontender CV: S1/S2 noted, no murmurs/rubs/gallops noted LUNGS: Lungs are clear to auscultation bilaterally, no apparent distress ABDOMEN: soft, nontender, no rebound or guarding GU:no cva tenderness, left testicle tenderness, left testicle enlargement, no overlying erythema, no hernia, chaperone present.  NEURO: Pt is awake/alert, moves all extremitiesx4 EXTREMITIES: pulses normal, full ROM SKIN: warm, color normal PSYCH: no abnormalities of mood noted  ED Course  Procedures  DIAGNOSTIC STUDIES: Oxygen Saturation is 98% on RA, normal by my interpretation.    COORDINATION OF CARE: 12:26 AM Discussed treatment plan with pt at bedside and pt agreed to plan.  2:03 AM US in progress at his time to rule out acute testicular torsion  US negative for acute disease Pt feels improved for d/c home  Labs Review Labs Reviewed  BASIC METABOLIC PANEL - Abnormal; Notable for the following:    Potassium 3.6 (*)    All other components within normal limits  CBC WITH DIFFERENTIAL  URINALYSIS, ROUTINE W REFLEX MICROSCOPIC     MDM   Final diagnoses:  None  Nursing notes including past medical history and social history reviewed and considered in documentation Labs/vital reviewed and considered   I personally performed the services described in this documentation, which was scribed in my presence. The recorded information has been reviewed and is accurate.    Joya Gaskins.  Natale Thoma W Wylder Macomber, MD 09/28/14 0930

## 2014-09-28 NOTE — ED Notes (Signed)
US lady called and states she is on her way.

## 2014-09-28 NOTE — Discharge Instructions (Signed)
Hydrocele, Adult Fluid can collect around the testicles. This fluid forms in a sac. This condition is called a hydrocele. The collected fluid causes swelling of the scrotum. Usually, it affects just one testicle. Most of the time, the condition does not cause pain. Sometimes, the hydrocele goes away on its own. Other times, surgery is needed to get rid of the fluid. CAUSES A hydrocele does not develop often. Different things can cause a hydrocele in a man, including:  Injury to the scrotum.  Infection.  X-ray of the area around the scrotum.  A tumor or cancer of the testicle.  Twisting of a testicle.  Decreased blood flow to the scrotum. SYMPTOMS   Swelling without pain. The hydrocele feels like a water-filled balloon.  Swelling with pain. This can occur if the hydrocele was caused by infection or twisting.  Mild discomfort in the scrotum.  The hydrocele may feel heavy.  Swelling that gets smaller when you lie down. DIAGNOSIS  Your caregiver will do a physical exam to decide if you have a hydrocele. This may include:  Asking questions about your overall health, today and in the past. Your caregiver may ask about any injuries, X-rays, or infections.  Pushing on your abdomen or asking you to change positions to see if the size of the hydrocele changes.  Shining a light through the scrotum (transillumination) to see if the fluid inside the scrotum is clear.  Blood tests and urine tests to check for infection.  Imaging studies that take pictures of the scrotum and testicles. TREATMENT  Treatment depends in part on what caused the condition. Options include:  Watchful waiting. Your caregiver checks the hydrocele every so often.  Different surgeries to drain the fluid.  A needle may be put into the scrotum to drain fluid (needle aspiration). Fluid often returns after this type of treatment.  A cut (incision) may be made in the scrotum to remove the fluid sac  (hydrocelectomy).  An incision may be made in the groin to repair a hydrocele that has contact with abdominal fluids (communicating hydrocele).  Medicines to treat an infection (antibiotics). HOME CARE INSTRUCTIONS  What you need to do at home may depend on the cause of the hydrocele and type of treatment. In general:  Take all medicine as directed by your caregiver. Follow the directions carefully.  Ask your caregiver if there is anything you should not do while you recover (activities, lifting, work, sex).  If you had surgery to repair a communicating hydrocele, recovery time may vary. Ask you caregiver about your recovery time.  Avoid heavy lifting for 4 to 6 weeks.  If you had an incision on the scrotum or groin, wash it for 2 to 3 days after surgery. Do this as long as the skin is closed and there are no gaps in the wound. Wash gently, and avoid rubbing the incision.  Keep all follow-up appointments. SEEK MEDICAL CARE IF:   Your scrotum seems to be getting larger.  The area becomes more and more uncomfortable. SEEK IMMEDIATE MEDICAL CARE IF:  You have a fever. Document Released: 05/12/2010 Document Revised: 09/12/2013 Document Reviewed: 05/12/2010 Exit[employer] Patient Information 2015 [employer]. This information is not intended to replace advice given to you by your health care provider. Make sure you discuss any questions you have with your health care provider.  

## 2014-09-28 NOTE — ED Notes (Signed)
Ultra sound is finished.

## 2014-09-28 NOTE — ED Notes (Signed)
Patient c/o left testicle pain since yesterday; states it feels tight.
